# Patient Record
Sex: Male | Born: 2000 | Race: Black or African American | Hispanic: No | Smoking: Former smoker
Health system: Southern US, Community
[De-identification: ages and names within clinical notes are randomized; demographics above are authoritative.]

## PROBLEM LIST (undated history)

## (undated) ENCOUNTER — Emergency Department (HOSPITAL_COMMUNITY): Discharge status: Against Medical Advice | Payer: Medicaid Other

## (undated) DIAGNOSIS — F909 Attention-deficit hyperactivity disorder, unspecified type: Secondary | ICD-10-CM

## (undated) DIAGNOSIS — T50901A Poisoning by unspecified drugs, medicaments and biological substances, accidental (unintentional), initial encounter: Secondary | ICD-10-CM

## (undated) DIAGNOSIS — F419 Anxiety disorder, unspecified: Secondary | ICD-10-CM

## (undated) DIAGNOSIS — Z7289 Other problems related to lifestyle: Secondary | ICD-10-CM

## (undated) DIAGNOSIS — J45909 Unspecified asthma, uncomplicated: Secondary | ICD-10-CM

## (undated) DIAGNOSIS — F322 Major depressive disorder, single episode, severe without psychotic features: Secondary | ICD-10-CM

## (undated) HISTORY — DX: Unspecified asthma, uncomplicated: J45.909

## (undated) HISTORY — PX: CIRCUMCISION: SUR203

---
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
## 2019-05-12 ENCOUNTER — Ambulatory Visit (HOSPITAL_COMMUNITY)
Admission: EM | Admit: 2019-05-12 | Discharge: 2019-05-12 | Disposition: A | Discharge status: Home / Self Care | Payer: Medicaid Other | Attending: Family Medicine | Admitting: Family Medicine

## 2019-05-12 ENCOUNTER — Other Ambulatory Visit: Payer: Self-pay

## 2019-05-12 ENCOUNTER — Encounter (HOSPITAL_COMMUNITY): Payer: Self-pay

## 2019-05-12 DIAGNOSIS — R112 Nausea with vomiting, unspecified: Secondary | ICD-10-CM | POA: Diagnosis not present

## 2019-05-12 MED ORDER — ONDANSETRON HCL 4 MG PO TABS: 4.0000 mg | ORAL_TABLET | Freq: Four times a day (QID) | ORAL | 0 refills | Status: DC

## 2019-05-12 NOTE — ED Provider Notes (Signed)
[place]    CSN: [id number] Arrival date & time: 05/12/19  1611     History   Chief Complaint Chief Complaint  Patient presents with  . Diarrhea    HPI [name] is a 18 y.o. male with history of asthma, anxiety, ADHD, remote history of intentional overdose and deliberate self cutting in the setting of severe MDD presenting for deficit.  Patient states that he has had severe nausea for the last month "at least "and has been vomiting 4-5 times daily for the last 3 days.  Patient has also had non-watery diarrhea that has been without blood, mucus, melena.  Patient states he is never had this before.  Patient has been able to keep down some food and beverage, though feels like he is almost always nauseous.  Patient denies fever, lightheadedness, dizziness, weakness, myalgias, arthralgias, malaise, fatigue, cough, shortness of breath, chest pain, abdominal pain.  She denies known sick contacts, no recent travel or contact with COVID positive persons.  He also denies urinary symptoms such as frequency, urgency, penile or testicular pain or swelling, discharge.  Patient has been taking Pepto-Bismol and ginger ale which has helped some of his symptoms.  Patient denies history of abdominal surgery.  Patient does admit to cessation of heavy marijuana use around the time of symptom onset as he relocated to his parents home.  Patient states that he was smoking a lot almost every day prior to his move.  Patient denies alcohol use, other miscellaneous drug use.  Patient denies SI/HI/medication overdose.  Patient states "that was a few years ago, I do not feel that way now ".  No new foods, changes to diet, significant stress at home per patient.    Past Medical History:  Diagnosis Date  . ADHD (attention deficit hyperactivity disorder)   . Anxiety    Anxiety w/ Panic Attacks  . Asthma   . Deliberate self-cutting   . Overdose    Multiple Overdose attempts  . Severe major  depressive disorder [facility])     Patient Active Problem List   Diagnosis Date Noted  . Bipolar disorder ([place]) 05/11/2017  . DMDD (disruptive mood dysregulation disorder) ([place]) 05/11/2017  . Decreased appetite 08/23/2016  . Severe episode of recurrent major depressive disorder, without psychotic features ([place]) 08/17/2016  . Headache 02/16/2016  . Insomnia 02/15/2016  . Bipolar 2 disorder ([place]) 02/13/2016  . Generalized abdominal pain 08/22/2015  . Major depressive disorder, recurrent, moderate ([place]) 06/02/2015  . Suicide attempt by drug ingestion ([place]) 05/30/2014  . Overdose 05/30/2014  . Bradycardia, drug induced 05/30/2014  . Hypoventilation 05/30/2014  . Generalized anxiety disorder 04/19/2014  . Oppositional defiant disorder 04/19/2014    Past Surgical History:  Procedure Laterality Date  . CIRCUMCISION         Home Medications    Prior to Admission medications   Medication Sig Start Date End Date Taking? Authorizing Provider  chlorproMAZINE (THORAZINE) 25 MG tablet Take 1 tablet (25 mg total) by mouth 2 (two) times daily. Patient not taking: Reported on 07/26/2017 05/19/17   Philipp Ovens, MD  divalproex (DEPAKOTE ER) 250 MG 24 hr tablet Take 3 tablets (750 mg total) by mouth daily. Patient not taking: Reported on 07/26/2017 05/20/17   Philipp Ovens, MD  feeding supplement, ENSURE ENLIVE, (ENSURE ENLIVE) LIQD Take 237 mLs by mouth 2 (two) times daily between meals. Patient not taking: Reported on 07/26/2017 05/19/17   Philipp Ovens, MD  ondansetron [facility]) 4 MG  tablet Take 1 tablet (4 mg total) by mouth every 6 (six) hours. 05/12/19   Hall-Potvin, [place], PA-C    Family History History reviewed. No pertinent family history.  Social History Social History   Tobacco Use  . Smoking status: Former Smoker    Packs/day: 0.00    Types: Cigarettes  . Smokeless tobacco: Never Used  Substance Use Topics  . Alcohol use: No  . Drug use: Yes     Types: Marijuana    Comment: Hx of marijuana use, last used 1 week ago     Allergies   Penicillins   Review of Systems As per HPI   Physical Exam Triage Vital Signs ED Triage Vitals  Enc Vitals Group     BP 05/12/19 1656 131/77     Pulse Rate 05/12/19 1656 77     Resp 05/12/19 1656 16     Temp 05/12/19 1656 99 F (37.2 C)     Temp src --      SpO2 05/12/19 1656 100 %     Weight 05/12/19 1657 130 lb (59 kg)     Height --      Head Circumference --      Peak Flow --      Pain Score --      Pain Loc --      Pain Edu? --      Excl. in GC? --    No data found.  Updated Vital Signs BP 131/77 (BP Location: Right Arm)   Pulse 77   Temp 99 F (37.2 C)   Resp 16   Wt 130 lb (59 kg)   SpO2 100%   Visual Acuity Right Eye Distance:   Left Eye Distance:   Bilateral Distance:    Right Eye Near:   Left Eye Near:    Bilateral Near:     Physical Exam Constitutional:      General: He is not in acute distress.    Appearance: He is not toxic-appearing.  HENT:     Head: Normocephalic and atraumatic.     Right Ear: Tympanic membrane, ear canal and external ear normal.     Left Ear: Tympanic membrane, ear canal and external ear normal.     Nose: Nose normal.     Mouth/Throat:     Mouth: Mucous membranes are moist.     Pharynx: Oropharynx is clear. No oropharyngeal exudate or posterior oropharyngeal erythema.  Eyes:     General: No scleral icterus.       Right eye: No discharge.        Left eye: No discharge.     Extraocular Movements: Extraocular movements intact.     Conjunctiva/sclera: Conjunctivae normal.     Pupils: Pupils are equal, round, and reactive to light.  Neck:     Musculoskeletal: Normal range of motion and neck supple. No muscular tenderness.  Cardiovascular:     Rate and Rhythm: Normal rate and regular rhythm.     Heart sounds: Normal heart sounds.  Pulmonary:     Effort: Pulmonary effort is normal. No respiratory distress.     Breath sounds:  Normal breath sounds.  Abdominal:     General: Abdomen is flat. Bowel sounds are normal. There is no distension.     Palpations: Abdomen is soft.     Tenderness: There is no abdominal tenderness. There is no right CVA tenderness, left CVA tenderness, guarding or rebound.  Musculoskeletal:     Comments: Full active ROM  of upper and lower extremities bilaterally with 5/5 strength.  Gait is normal.  Lymphadenopathy:     Cervical: No cervical adenopathy.  Skin:    General: Skin is warm.     Capillary Refill: Capillary refill takes less than 2 seconds.     Coloration: Skin is not jaundiced or pale.     Findings: No bruising, erythema or rash.  Neurological:     General: No focal deficit present.     Mental Status: He is alert and oriented to person, place, and time.  Psychiatric:        Behavior: Behavior normal.        Thought Content: Thought content normal.        Judgment: Judgment normal.      UC Treatments / Results  Labs (all labs ordered are listed, but only abnormal results are displayed) Labs Reviewed - No data to display  EKG None  Radiology No results found.  Procedures Procedures (including critical care time)  Medications Ordered in UC Medications - No data to display  Initial Impression / Assessment and Plan / UC Course  I have reviewed the triage vital signs and the nursing notes.  Pertinent labs & imaging results that were available during my care of the patient were reviewed by me and considered in my medical decision making (see chart for details).     18 year old male with significant psychiatric history presenting for chronic nausea, recent vomiting.  Extensive ROS largely negative.  Overall, patient appears to be well, no acute distress.  Hemodynamically stable.  Does have elevated temperature, though no signs of hypovolemia.  Feels well otherwise.  Cessation of heavy, chronic marijuana use corresponds with symptom onset.  Discussed that this could be  cannabinoid hyperemesis, though cannot rule out viral etiology.  Patient looking to establish PCP (contact given).  Patient elects to defer diagnostic labs at that appointment as opposed to preliminary blood work in office today.  Discussed return precautions, patient verbalized understanding. Final Clinical Impressions(s) / UC Diagnoses   Final diagnoses:  Intractable vomiting with nausea, unspecified vomiting type     Discharge Instructions     Be sure to make appointment with PCP to have further work-up help determine cause of nausea. Avoid marijuana use in the interim. May take Zofran as needed for severe nausea. Return if you develop fever, lightheadedness/weakness, myalgias, chest pain, difficulty breathing, severe abdominal pain, issues urinating, or increased diarrhea.    ED Prescriptions    Medication Sig Dispense Auth. Provider   ondansetron (ZOFRAN) 4 MG tablet Take 1 tablet (4 mg total) by mouth every 6 (six) hours. 12 tablet Hall-Potvin, [place], PA-C     Controlled Substance Prescriptions [place] Controlled Substance Registry consulted? Not Applicable   Shea EvansHall-Potvin, Brittany, [place] JerseyPA-C 05/13/19 1818

## 2019-05-12 NOTE — ED Triage Notes (Signed)
Pt states he has nausea x 1 month or more and he has diarrhea x 2 days. Pt has been for x 3 days.

## 2019-05-12 NOTE — Discharge Instructions (Addendum)
Be sure to make appointment with PCP to have further work-up help determine cause of nausea. Avoid marijuana use in the interim. May take Zofran as needed for severe nausea. Return if you develop fever, lightheadedness/weakness, myalgias, chest pain, difficulty breathing, severe abdominal pain, issues urinating, or increased diarrhea.

## 2019-05-13 ENCOUNTER — Encounter (HOSPITAL_COMMUNITY): Payer: Self-pay | Admitting: Emergency Medicine

## 2019-06-17 ENCOUNTER — Ambulatory Visit (HOSPITAL_COMMUNITY)
Admission: EM | Admit: 2019-06-17 | Discharge: 2019-06-17 | Disposition: A | Discharge status: Home / Self Care | Payer: Medicaid Other | Attending: Family Medicine | Admitting: Family Medicine

## 2019-06-17 ENCOUNTER — Encounter (HOSPITAL_COMMUNITY): Payer: Self-pay | Admitting: Emergency Medicine

## 2019-06-17 ENCOUNTER — Other Ambulatory Visit: Payer: Self-pay

## 2019-06-17 DIAGNOSIS — M791 Myalgia, unspecified site: Secondary | ICD-10-CM

## 2019-06-17 LAB — CBC WITH DIFFERENTIAL/PLATELET
Abs Immature Granulocytes: 0.01 10*3/uL (ref 0.00–0.07)
Basophils Absolute: 0 10*3/uL (ref 0.0–0.1)
Basophils Relative: 0 %
Eosinophils Absolute: 0.6 10*3/uL — ABNORMAL HIGH (ref 0.0–0.5)
Eosinophils Relative: 11 %
HCT: 42.9 % (ref 39.0–52.0)
Hemoglobin: 14.4 g/dL (ref 13.0–17.0)
Immature Granulocytes: 0 %
Lymphocytes Relative: 38 %
Lymphs Abs: 1.9 10*3/uL (ref 0.7–4.0)
MCH: 29 pg (ref 26.0–34.0)
MCHC: 33.6 g/dL (ref 30.0–36.0)
MCV: 86.3 fL (ref 80.0–100.0)
Monocytes Absolute: 0.6 10*3/uL (ref 0.1–1.0)
Monocytes Relative: 12 %
Neutro Abs: 1.9 10*3/uL (ref 1.7–7.7)
Neutrophils Relative %: 39 %
Platelets: 244 10*3/uL (ref 150–400)
RBC: 4.97 MIL/uL (ref 4.22–5.81)
RDW: 12.3 % (ref 11.5–15.5)
WBC: 5 10*3/uL (ref 4.0–10.5)
nRBC: 0 % (ref 0.0–0.2)

## 2019-06-17 LAB — COMPREHENSIVE METABOLIC PANEL
ALT: 18 U/L (ref 0–44)
AST: 26 U/L (ref 15–41)
Albumin: 4.4 g/dL (ref 3.5–5.0)
Alkaline Phosphatase: 83 U/L (ref 38–126)
Anion gap: 10 (ref 5–15)
BUN: 14 mg/dL (ref 6–20)
CO2: 26 mmol/L (ref 22–32)
Calcium: 9.6 mg/dL (ref 8.9–10.3)
Chloride: 103 mmol/L (ref 98–111)
Creatinine, Ser: 0.8 mg/dL (ref 0.61–1.24)
GFR calc Af Amer: >60 mL/min (ref >60)
GFR calc non Af Amer: >60 mL/min (ref >60)
Glucose, Bld: 88 mg/dL (ref 70–99)
Potassium: 3.2 mmol/L — ABNORMAL LOW (ref 3.5–5.1)
Sodium: 139 mmol/L (ref 135–145)
Total Bilirubin: 2.2 mg/dL — ABNORMAL HIGH (ref 0.3–1.2)
Total Protein: 8.5 g/dL — ABNORMAL HIGH (ref 6.5–8.1)

## 2019-06-17 LAB — SEDIMENTATION RATE: Sed Rate: 20 mm/hr — ABNORMAL HIGH (ref 0–16)

## 2019-06-17 LAB — CK: Total CK: 435 U/L — ABNORMAL HIGH (ref 49–397)

## 2019-06-17 LAB — TSH: TSH: 1.07 u[IU]/mL (ref 0.350–4.500)

## 2019-06-17 MED ORDER — IBUPROFEN 800 MG PO TABS: 800.0000 mg | ORAL_TABLET | Freq: Three times a day (TID) | ORAL | 0 refills | Status: DC

## 2019-06-17 NOTE — ED Triage Notes (Signed)
Pt here for bilateral upper thigh pain that feels like "muscle strain" per pt

## 2019-06-17 NOTE — Discharge Instructions (Signed)
I am doing blood work to try to determine the cause of your muscle pain. In the meantime take ibuprofen 3 times a day with food Activity as tolerated You will need to follow-up with your primary care doctor

## 2019-06-17 NOTE — ED Provider Notes (Signed)
[place]    CSN: [id number] Arrival date & time: 06/17/19  1153      History   Chief Complaint Chief Complaint  Patient presents with  . Leg Pain    HPI [name] is a 18 y.o. male.   HPI  This is a 18 year old.  On no medications.  Currently working.  He states that he has been had pain in his leg muscles since Tuesday evening.  Started gradually throughout the day.  No accident, no injury, no overuse.  He states he tries to take a walk once a day but usually does not have any pain when doing so.  Has not changed activity.  Has not done any new exercises, squats, anything to strain his thigh muscles.  In addition today his left calf is starting to hurt. He states when he was in a grocery store yesterday his legs hurt so much that he felt like there were any give out on him. No body aches in general.  No fatigue.  No fever or chills.  No known exposure to coronavirus.  He states he is good about wearing his mask.  He does not go out of the house very much.  He is not working.  Past Medical History:  Diagnosis Date  . ADHD (attention deficit hyperactivity disorder)   . Anxiety    Anxiety w/ Panic Attacks  . Asthma   . Deliberate self-cutting   . Overdose    Multiple Overdose attempts  . Severe major depressive disorder [facility])     Patient Active Problem List   Diagnosis Date Noted  . DMDD (disruptive mood dysregulation disorder) ([place]) 05/11/2017  . Bipolar 2 disorder ([place]) 02/13/2016  . Major depressive disorder, recurrent, moderate ([place]) 06/02/2015  . Suicide attempt by drug ingestion ([place]) 05/30/2014  . Generalized anxiety disorder 04/19/2014  . Oppositional defiant disorder 04/19/2014    Past Surgical History:  Procedure Laterality Date  . CIRCUMCISION         Home Medications    Prior to Admission medications   Medication Sig Start Date End Date Taking? Authorizing Provider  ibuprofen (ADVIL) 800 MG tablet Take 1 tablet (800 mg total) by  mouth 3 (three) times daily. 06/17/19   Raylene Everts, MD  chlorproMAZINE (THORAZINE) 25 MG tablet Take 1 tablet (25 mg total) by mouth 2 (two) times daily. Patient not taking: Reported on 07/26/2017 05/19/17 06/17/19  Philipp Ovens, MD  divalproex (DEPAKOTE ER) 250 MG 24 hr tablet Take 3 tablets (750 mg total) by mouth daily. Patient not taking: Reported on 07/26/2017 05/20/17 06/17/19  Philipp Ovens, MD    Family History Family History  Problem Relation Age of Onset  . Asthma Mother     Social History Social History   Tobacco Use  . Smoking status: Former Smoker    Packs/day: 0.00    Types: Cigarettes  . Smokeless tobacco: Never Used  Substance Use Topics  . Alcohol use: No  . Drug use: Yes    Types: Marijuana    Comment: Hx of marijuana use, last used 1 week ago     Allergies   Penicillins   Review of Systems Review of Systems  Constitutional: Negative for chills and fever.  HENT: Negative for ear pain and sore throat.   Eyes: Negative for pain and visual disturbance.  Respiratory: Negative for cough and shortness of breath.   Cardiovascular: Negative for chest pain and palpitations.  Gastrointestinal: Negative for abdominal pain and  vomiting.  Genitourinary: Negative for dysuria and hematuria.  Musculoskeletal: Positive for gait problem and myalgias. Negative for arthralgias and back pain.  Skin: Negative for color change and rash.  Neurological: Negative for seizures and syncope.  All other systems reviewed and are negative.    Physical Exam Triage Vital Signs ED Triage Vitals [06/17/19 1218]  Enc Vitals Group     BP 125/86     Pulse Rate 78     Resp 18     Temp 98.4 F (36.9 C)     Temp Source Oral     SpO2 98 %     Weight      Height      Head Circumference      Peak Flow      Pain Score 0     Pain Loc      Pain Edu?      Excl. in GC?    No data found.  Updated Vital Signs BP 125/86 (BP Location: Right Arm)   Pulse  78   Temp 98.4 F (36.9 C) (Oral)   Resp 18   SpO2 98%   Visual Acuity Right Eye Distance:   Left Eye Distance:   Bilateral Distance:    Right Eye Near:   Left Eye Near:    Bilateral Near:     Physical Exam Constitutional:      General: He is not in acute distress.    Appearance: He is well-developed.  HENT:     Head: Normocephalic and atraumatic.  Eyes:     Conjunctiva/sclera: Conjunctivae normal.     Pupils: Pupils are equal, round, and reactive to light.  Neck:     Musculoskeletal: Normal range of motion.  Cardiovascular:     Rate and Rhythm: Normal rate and regular rhythm.     Heart sounds: Normal heart sounds.  Pulmonary:     Effort: Pulmonary effort is normal. No respiratory distress.     Breath sounds: Normal breath sounds.  Abdominal:     General: There is no distension.     Palpations: Abdomen is soft.  Musculoskeletal: Normal range of motion.     Comments: Patient is very lean.  Quadricep muscles are tender to palpation.  He is able to fully straighten legs against resistance without muscular weakness.  Skin:    General: Skin is warm and dry.  Neurological:     Mental Status: He is alert.      UC Treatments / Results  Labs (all labs ordered are listed, but only abnormal results are displayed) Labs Reviewed  COMPREHENSIVE METABOLIC PANEL  CBC WITH DIFFERENTIAL/PLATELET  SEDIMENTATION RATE  VITAMIN D 25 HYDROXY (VIT D DEFICIENCY, FRACTURES)  CK  TSH    EKG   Radiology No results found.  Procedures Procedures (including critical care time)  Medications Ordered in UC Medications - No data to display  Initial Impression / Assessment and Plan / UC Course  I have reviewed the triage vital signs and the nursing notes.  Pertinent labs & imaging results that were available during my care of the patient were reviewed by me and considered in my medical decision making (see chart for details).     Uncertain why this young man would have  myalgias.  No recent illness.  No fever or virus.  No known family history of muscular problems.  We will do screening lab work.  Notify him if any test results are positive.  He can follow-up with his PCP. Final  Clinical Impressions(s) / UC Diagnoses   Final diagnoses:  Myalgia     Discharge Instructions     I am doing blood work to try to determine the cause of your muscle pain. In the meantime take ibuprofen 3 times a day with food Activity as tolerated You will need to follow-up with your primary care doctor   ED Prescriptions    Medication Sig Dispense Auth. Provider   ibuprofen (ADVIL) 800 MG tablet Take 1 tablet (800 mg total) by mouth 3 (three) times daily. 21 tablet Eustace MooreNelson, Malachi Suderman Sue, MD     Controlled Substance Prescriptions [place] Controlled Substance Registry consulted? Not Applicable   Eustace MooreNelson, Japhet Morgenthaler Sue, MD 06/17/19 1319

## 2019-06-18 ENCOUNTER — Telehealth (HOSPITAL_COMMUNITY): Payer: Self-pay | Admitting: Emergency Medicine

## 2019-06-18 LAB — VITAMIN D 25 HYDROXY (VIT D DEFICIENCY, FRACTURES): Vit D, 25-Hydroxy: 22 ng/mL — ABNORMAL LOW (ref 30.0–100.0)

## 2019-06-18 NOTE — Telephone Encounter (Signed)
Attempted to reach patient. No answer at this time. Voicemail left.  Per Dr. Lanny Cramp pt needs to take OTC vit D.

## 2021-03-31 ENCOUNTER — Ambulatory Visit
Admission: EM | Admit: 2021-03-31 | Discharge: 2021-03-31 | Disposition: A | Discharge status: Home / Self Care | Payer: Medicaid Other | Attending: Family Medicine | Admitting: Family Medicine

## 2021-03-31 ENCOUNTER — Encounter: Payer: Self-pay | Admitting: Emergency Medicine

## 2021-03-31 ENCOUNTER — Other Ambulatory Visit: Payer: Self-pay

## 2021-03-31 DIAGNOSIS — B349 Viral infection, unspecified: Secondary | ICD-10-CM

## 2021-03-31 DIAGNOSIS — R509 Fever, unspecified: Secondary | ICD-10-CM

## 2021-03-31 MED ORDER — IBUPROFEN 600 MG PO TABS: 600.0000 mg | ORAL_TABLET | Freq: Four times a day (QID) | ORAL | 0 refills | Status: DC | PRN

## 2021-03-31 NOTE — Discharge Instructions (Signed)
Your COVID and Influenza tests are pending.  You should self quarantine until the test results are back.    Take Tylenol or ibuprofen as needed for fever or discomfort.  Rest and keep yourself hydrated.    Follow-up with your primary care provider if your symptoms are not improving.     

## 2021-03-31 NOTE — ED Provider Notes (Signed)
[facility]    CSN: [id number] Arrival date & time: 03/31/21  1450      History   Chief Complaint No chief complaint on file.   HPI [name] is a 20 y.o. male.   Reports headache, nausea for the last 2 days, body aches, cough, eye drainage, fever.  States that T-max was 101 last night at home.  Has taken over-the-counter cough and cold with no relief.  Reports that his fiance is also sick.  States that she was tested for COVID yesterday, and is awaiting her results.  Has negative history of COVID.  Has not completed COVID vaccines.  Has completed flu vaccine.  Denies abdominal pain, diarrhea, vomiting, rash, ear pain, other symptoms.  ROS per HPI  The history is provided by the patient.    Past Medical History:  Diagnosis Date  . ADHD (attention deficit hyperactivity disorder)   . Anxiety    Anxiety w/ Panic Attacks  . Asthma   . Deliberate self-cutting   . Overdose    Multiple Overdose attempts  . Severe major depressive disorder [facility])     Patient Active Problem List   Diagnosis Date Noted  . DMDD (disruptive mood dysregulation disorder) ([facility]) 05/11/2017  . Bipolar 2 disorder ([facility]) 02/13/2016  . Major depressive disorder, recurrent, moderate ([facility]) 06/02/2015  . Suicide attempt by drug ingestion ([facility]) 05/30/2014  . Generalized anxiety disorder 04/19/2014  . Oppositional defiant disorder 04/19/2014    Past Surgical History:  Procedure Laterality Date  . CIRCUMCISION         Home Medications    Prior to Admission medications   Medication Sig Start Date End Date Taking? Authorizing Provider  ibuprofen (ADVIL) 600 MG tablet Take 1 tablet (600 mg total) by mouth every 6 (six) hours as needed. 03/31/21  Yes Moshe Cipro, NP  chlorproMAZINE (THORAZINE) 25 MG tablet Take 1 tablet (25 mg total) by mouth 2 (two) times daily. Patient not taking: Reported on 07/26/2017 05/19/17 06/17/19  Thedora Hinders, MD  divalproex (DEPAKOTE ER)  250 MG 24 hr tablet Take 3 tablets (750 mg total) by mouth daily. Patient not taking: Reported on 07/26/2017 05/20/17 06/17/19  Thedora Hinders, MD    Family History Family History  Problem Relation Age of Onset  . Asthma Mother     Social History Social History   Tobacco Use  . Smoking status: Former Smoker    Packs/day: 0.00    Types: Cigarettes  . Smokeless tobacco: Never Used  Vaping Use  . Vaping Use: Never used  Substance Use Topics  . Alcohol use: No  . Drug use: Yes    Types: Marijuana    Comment: Hx of marijuana use, last used 1 week ago     Allergies   Penicillins   Review of Systems Review of Systems   Physical Exam Triage Vital Signs ED Triage Vitals  Enc Vitals Group     BP 03/31/21 1458 113/74     Pulse Rate 03/31/21 1458 81     Resp 03/31/21 1458 18     Temp 03/31/21 1458 99.7 F (37.6 C)     Temp Source 03/31/21 1458 Oral     SpO2 03/31/21 1458 95 %     Weight --      Height --      Head Circumference --      Peak Flow --      Pain Score 03/31/21 1500 9     Pain Loc --  Pain Edu? --      Excl. in GC? --    No data found.  Updated Vital Signs BP 113/74 (BP Location: Right Arm)   Pulse 81   Temp 99.7 F (37.6 C) (Oral)   Resp 18   SpO2 95%       Physical Exam Vitals and nursing note reviewed.  Constitutional:      General: He is not in acute distress.    Appearance: Normal appearance. He is well-developed and normal weight. He is ill-appearing.  HENT:     Head: Normocephalic and atraumatic.     Right Ear: Tympanic membrane, ear canal and external ear normal.     Left Ear: Tympanic membrane, ear canal and external ear normal.     Nose: Congestion and rhinorrhea present.     Mouth/Throat:     Mouth: Mucous membranes are moist.     Pharynx: Posterior oropharyngeal erythema present.  Eyes:     Extraocular Movements: Extraocular movements intact.     Conjunctiva/sclera: Conjunctivae normal.     Pupils: Pupils are  equal, round, and reactive to light.  Cardiovascular:     Rate and Rhythm: Normal rate and regular rhythm.     Heart sounds: Normal heart sounds. No murmur heard.   Pulmonary:     Effort: Pulmonary effort is normal. No respiratory distress.     Breath sounds: Normal breath sounds. No stridor. No wheezing, rhonchi or rales.  Chest:     Chest wall: No tenderness.  Abdominal:     General: Bowel sounds are normal.     Palpations: Abdomen is soft.     Tenderness: There is no abdominal tenderness.  Musculoskeletal:        General: Normal range of motion.     Cervical back: Neck supple.  Lymphadenopathy:     Cervical: Cervical adenopathy present.  Skin:    General: Skin is warm and dry.     Capillary Refill: Capillary refill takes less than 2 seconds.  Neurological:     General: No focal deficit present.     Mental Status: He is alert and oriented to person, place, and time.  Psychiatric:        Mood and Affect: Mood normal.        Behavior: Behavior normal.        Thought Content: Thought content normal.      UC Treatments / Results  Labs (all labs ordered are listed, but only abnormal results are displayed) Labs Reviewed  COVID-19, FLU A+B NAA    EKG   Radiology No results found.  Procedures Procedures (including critical care time)  Medications Ordered in UC Medications - No data to display  Initial Impression / Assessment and Plan / UC Course  I have reviewed the triage vital signs and the nursing notes.  Pertinent labs & imaging results that were available during my care of the patient were reviewed by me and considered in my medical decision making (see chart for details).    Viral illness Fever  Work note provided Prescribed ibuprofen prn Covid and flu swab obtained in office today.   Patient instructed to quarantine until results are back and negative.   If results are negative, patient may resume daily schedule as tolerated once they are fever free  for 24 hours without the use of antipyretic medications.   If results are positive, patient instructed to quarantine for at least 5 days from symptom onset.  If after 5 days  symptoms have resolved, may return to work with a well fitting mask for the next 5 days. If symptomatic after day 5, isolation should be extended to 10 days. Patient instructed to follow-up with primary care or with this office as needed.   Patient instructed to follow-up in the ER for trouble swallowing, trouble breathing, other concerning symptoms.   Final Clinical Impressions(s) / UC Diagnoses   Final diagnoses:  Viral illness  Fever, unspecified fever cause     Discharge Instructions     Your COVID and Influenza tests are pending.  You should self quarantine until the test results are back.    Take Tylenol or ibuprofen as needed for fever or discomfort.  Rest and keep yourself hydrated.    Follow-up with your primary care provider if your symptoms are not improving.        ED Prescriptions    Medication Sig Dispense Auth. Provider   ibuprofen (ADVIL) 600 MG tablet Take 1 tablet (600 mg total) by mouth every 6 (six) hours as needed. 30 tablet Moshe Cipro, NP     PDMP not reviewed this encounter.   Moshe Cipro, NP 03/31/21 1519

## 2021-03-31 NOTE — ED Triage Notes (Signed)
Headache and nausea 2 days ago, body aches, coughing eye drainage, fever

## 2021-04-02 LAB — COVID-19, FLU A+B NAA
Influenza A, NAA: NOT DETECTED
Influenza B, NAA: NOT DETECTED
SARS-CoV-2, NAA: DETECTED — AB

## 2021-04-05 ENCOUNTER — Other Ambulatory Visit: Payer: Self-pay

## 2021-04-05 ENCOUNTER — Encounter: Payer: Self-pay | Admitting: Emergency Medicine

## 2021-04-05 ENCOUNTER — Ambulatory Visit
Admission: EM | Admit: 2021-04-05 | Discharge: 2021-04-05 | Disposition: A | Discharge status: Home / Self Care | Payer: Medicaid Other

## 2021-04-05 DIAGNOSIS — U071 COVID-19: Secondary | ICD-10-CM | POA: Diagnosis not present

## 2021-04-05 DIAGNOSIS — J069 Acute upper respiratory infection, unspecified: Secondary | ICD-10-CM

## 2021-04-05 NOTE — Discharge Instructions (Signed)
Unable to rule out cardiac disease or blood clot in urgent care setting.  Offered patient further evaluation and management in the ED.  Patient declines at this time and would like to try outpatient therapy first.  Aware of the risk associated with this decision including missed diagnosis, organ damage, organ failure, and/or death.  Patient aware and in agreement.     Get plenty of rest and push fluids Continue with prescribed medications Use OTC zyrtec for nasal congestion, runny nose, and/or sore throat Use OTC flonase for nasal congestion and runny nose Use medications daily for symptom relief Use OTC medications like ibuprofen or tylenol as needed fever or pain Call or go to the ED if you have any new or worsening symptoms such as fever, worsening cough, shortness of breath, chest tightness, chest pain, turning blue, changes in mental status, etc..Marland Kitchen

## 2021-04-05 NOTE — ED Triage Notes (Signed)
Tested positive for covid on May 14th.  States symptoms have become worse.  Sore throat, headache, body aches, and cough.
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
## 2021-09-13 ENCOUNTER — Ambulatory Visit: Payer: Medicaid Other | Attending: Internal Medicine | Admitting: Internal Medicine

## 2021-09-13 ENCOUNTER — Other Ambulatory Visit: Payer: Self-pay

## 2021-09-13 ENCOUNTER — Encounter: Payer: Self-pay | Admitting: Internal Medicine

## 2021-09-13 VITALS — BP 120/70 | HR 86 | Resp 16 | Ht 72.0 in | Wt 163.4 lb

## 2021-09-13 DIAGNOSIS — F411 Generalized anxiety disorder: Secondary | ICD-10-CM

## 2021-09-13 DIAGNOSIS — Z8659 Personal history of other mental and behavioral disorders: Secondary | ICD-10-CM | POA: Diagnosis not present

## 2021-09-13 DIAGNOSIS — F321 Major depressive disorder, single episode, moderate: Secondary | ICD-10-CM | POA: Diagnosis not present

## 2021-09-13 DIAGNOSIS — Z0000 Encounter for general adult medical examination without abnormal findings: Secondary | ICD-10-CM

## 2021-09-13 DIAGNOSIS — Z2821 Immunization not carried out because of patient refusal: Secondary | ICD-10-CM

## 2021-09-13 DIAGNOSIS — J4520 Mild intermittent asthma, uncomplicated: Secondary | ICD-10-CM

## 2021-09-13 NOTE — Patient Instructions (Signed)
Preventive Care 18-21 Years Old, Male Preventive care refers to lifestyle choices and visits with your health care provider that can promote health and wellness. At this stage in your life, you may start seeing a primary care physician instead of a pediatrician. It is important to take responsibility for your health and well-being. Preventive care for young adults includes: A yearly physical exam. This is also called an annual wellness visit. Regular dental and eye exams. Immunizations. Screening for certain conditions. Healthy lifestyle choices, such as: Eating a healthy diet. Getting regular exercise. Not using drugs or products that contain nicotine and tobacco. Limiting alcohol use. What can I expect for my preventive care visit? Physical exam Your health care provider may check your: Height and weight. These may be used to calculate your BMI (body mass index). BMI is a measurement that tells if you are at a healthy weight. Heart rate and blood pressure. Body temperature. Skin for abnormal spots. Counseling Your health care provider may ask you questions about your: Past medical problems. Family's medical history. Alcohol, tobacco, and drug use. Home life and relationship well-being. Access to firearms. Emotional well-being. Diet, exercise, and sleep habits. Sexual activity and sexual health. What immunizations do I need? Vaccines are usually given at various ages, according to a schedule. Your health care provider will recommend vaccines for you based on your age, medical history, and lifestyle or other factors, such as travel or where you work. What tests do I need? Blood tests Lipid and cholesterol levels. These may be checked every 5 years starting at age 20. Hepatitis C test. Hepatitis B test. Screening Genital exam to check for testicular cancer or hernias. STD (sexually transmitted disease) testing, if you are at risk. Other tests Tuberculosis skin test. Vision and  hearing tests. Skin exam. Talk with your health care provider about your test results, treatment options, and if necessary, the need for more tests. Follow these instructions at home: Eating and drinking  Eat a healthy diet that includes fresh fruits and vegetables, whole grains, lean protein, and low-fat dairy products. Drink enough fluid to keep your urine pale yellow. Do not drink alcohol if: Your health care provider tells you not to drink. You are under the legal drinking age. In the [place] the legal drinking age is 21. If you drink alcohol: Limit how much you use to 0-2 drinks a day. Be aware of how much alcohol is in your drink. In the [place] one drink equals one 12 oz bottle of beer (355 mL), one 5 oz glass of wine (148 mL), or one 1 oz glass of hard liquor (44 mL). Lifestyle Take daily care of your teeth and gums. Brush your teeth every morning and night with fluoride toothpaste. Floss one time each day. Stay active. Exercise for at least 30 minutes 5 or more days of the week. Do not use any products that contain nicotine or tobacco, such as cigarettes, e-cigarettes, and chewing tobacco. If you need help quitting, ask your health care provider. Do not use drugs. If you are sexually active, practice safe sex. Use a condom or other form of protection to prevent STIs (sexually transmitted infections). Find healthy ways to cope with stress, such as: Meditation, yoga, or listening to music. Journaling. Talking to a trusted person. Spending time with friends and family. Safety Always wear your seat belt while driving or riding in a vehicle. Do not drive: If you have been drinking alcohol. Do not ride with someone who has been drinking.   When you are tired or distracted. While texting. Wear a helmet and other protective equipment during sports activities. If you have firearms in your house, make sure you follow all gun safety procedures. Seek help if you have been bullied, physically  abused, or sexually abused. Use the Internet responsibly to avoid dangers, such as online bullying and online sex predators. What's next? Go to your health care provider once a year for an annual wellness visit. Ask your health care provider how often you should have your eyes and teeth checked. Stay up to date on all vaccines. This information is not intended to replace advice given to you by your health care provider. Make sure you discuss any questions you have with your health care provider. Document Revised: 01/12/2021 Document Reviewed: 10/29/2018 Elsevier Patient Education  2022 Elsevier Inc.  
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
## 2021-10-08 ENCOUNTER — Telehealth: Payer: Self-pay | Admitting: Clinical

## 2022-03-15 NOTE — Telephone Encounter (Signed)
Error

## 2022-06-13 ENCOUNTER — Encounter (HOSPITAL_COMMUNITY): Payer: Self-pay

## 2022-06-13 ENCOUNTER — Other Ambulatory Visit: Payer: Self-pay

## 2022-06-13 DIAGNOSIS — J45909 Unspecified asthma, uncomplicated: Secondary | ICD-10-CM | POA: Diagnosis present

## 2022-06-13 DIAGNOSIS — Z5321 Procedure and treatment not carried out due to patient leaving prior to being seen by health care provider: Secondary | ICD-10-CM | POA: Diagnosis not present

## 2022-06-13 NOTE — ED Triage Notes (Signed)
Pt presents with trouble breathing due to seasonal allergies. Pt has his inhaler but he is out of puffs. Wanting a breathing treatment and inhaler. He in no sign of respiratory distress.

## 2022-06-14 ENCOUNTER — Emergency Department (HOSPITAL_COMMUNITY)
Admission: EM | Admit: 2022-06-14 | Discharge: 2022-06-14 | Discharge status: Against Medical Advice | Payer: Medicaid Other | Attending: Emergency Medicine | Admitting: Emergency Medicine

## 2022-06-16 ENCOUNTER — Other Ambulatory Visit: Payer: Self-pay

## 2022-06-16 ENCOUNTER — Encounter (HOSPITAL_COMMUNITY): Payer: Self-pay

## 2022-06-16 ENCOUNTER — Emergency Department (HOSPITAL_COMMUNITY)
Admission: EM | Admit: 2022-06-16 | Discharge: 2022-06-16 | Disposition: A | Discharge status: Home / Self Care | Payer: Medicaid Other | Attending: Emergency Medicine | Admitting: Emergency Medicine

## 2022-06-16 DIAGNOSIS — J4541 Moderate persistent asthma with (acute) exacerbation: Secondary | ICD-10-CM

## 2022-06-16 DIAGNOSIS — J45909 Unspecified asthma, uncomplicated: Secondary | ICD-10-CM | POA: Diagnosis not present

## 2022-06-16 DIAGNOSIS — R0602 Shortness of breath: Secondary | ICD-10-CM | POA: Diagnosis present

## 2022-06-16 MED ORDER — PREDNISONE 20 MG PO TABS
20.0000 mg | ORAL_TABLET | Freq: Once | ORAL | Status: AC
  Administered 2022-06-16: 20 mg via ORAL
  Filled 2022-06-16: qty 1

## 2022-06-16 MED ORDER — IPRATROPIUM-ALBUTEROL 0.5-2.5 (3) MG/3ML IN SOLN
3.0000 mL | Freq: Once | RESPIRATORY_TRACT | Status: AC
  Administered 2022-06-16: 3 mL via RESPIRATORY_TRACT
  Filled 2022-06-16: qty 3

## 2022-06-16 MED ORDER — PREDNISONE 10 MG PO TABS: 20.0000 mg | ORAL_TABLET | Freq: Two times a day (BID) | ORAL | 0 refills | Status: DC

## 2022-06-16 NOTE — ED Triage Notes (Signed)
POV from home. Cc of wanting a neb tx for his asthma. Says he has an inhaler but would like breathing treatment. 95% while ambulating

## 2022-06-16 NOTE — ED Provider Notes (Signed)
[facility] EMERGENCY DEPARTMENT Provider Note   CSN: [id number] Arrival date & time: 06/16/22  0151     History  Chief Complaint  Patient presents with   Wants Neb Tx    [name] is a 21 y.o. male.  Patient is a 21 year old male with history of asthma.  He presents today with complaints of shortness of breath.  This has been worsening over the past several weeks.  He denies to me he is having any chest pain, fevers, chills, or productive cough.  He has an inhaler at home and has been using this with minimal relief.  He is requesting a nebulizer treatment.  The history is provided by the patient.       Home Medications Prior to Admission medications   Medication Sig Start Date End Date Taking? Authorizing Provider  ibuprofen (ADVIL) 600 MG tablet Take 1 tablet (600 mg total) by mouth every 6 (six) hours as needed. 03/31/21   Moshe Cipro, NP  chlorproMAZINE (THORAZINE) 25 MG tablet Take 1 tablet (25 mg total) by mouth 2 (two) times daily. Patient not taking: Reported on 07/26/2017 05/19/17 06/17/19  Saez-Benito, Phillips Climes, MD  divalproex (DEPAKOTE ER) 250 MG 24 hr tablet Take 3 tablets (750 mg total) by mouth daily. Patient not taking: Reported on 07/26/2017 05/20/17 06/17/19  Saez-Benito, Phillips Climes, MD      Allergies    Penicillins    Review of Systems   Review of Systems  All other systems reviewed and are negative.   Physical Exam Updated Vital Signs BP 114/63   Pulse 80   Temp 98.1 F (36.7 C) (Oral)   Resp 19   Ht 6\' 1"  (1.854 m)   Wt 68 kg   SpO2 98%   BMI 19.79 kg/m  Physical Exam Vitals and nursing note reviewed.  Constitutional:      General: He is not in acute distress.    Appearance: He is well-developed. He is not diaphoretic.  HENT:     Head: Normocephalic and atraumatic.  Cardiovascular:     Rate and Rhythm: Normal rate and regular rhythm.     Heart sounds: No murmur heard.    No friction rub.  Pulmonary:     Effort:  Pulmonary effort is normal. No respiratory distress.     Breath sounds: Wheezing present. No rales.     Comments: There are slight wheezes bilaterally, but no respiratory distress. Abdominal:     General: Bowel sounds are normal. There is no distension.     Palpations: Abdomen is soft.     Tenderness: There is no abdominal tenderness.  Musculoskeletal:        General: Normal range of motion.     Cervical back: Normal range of motion and neck supple.  Skin:    General: Skin is warm and dry.  Neurological:     Mental Status: He is alert and oriented to person, place, and time.     Coordination: Coordination normal.     ED Results / Procedures / Treatments   Labs (all labs ordered are listed, but only abnormal results are displayed) Labs Reviewed - No data to display  EKG None  Radiology No results found.  Procedures Procedures    Medications Ordered in ED Medications  ipratropium-albuterol (DUONEB) 0.5-2.5 (3) MG/3ML nebulizer solution 3 mL (has no administration in time range)  predniSONE (DELTASONE) tablet 20 mg (has no administration in time range)    ED Course/ Medical Decision Making/  A&P  Patient presenting with complaints of shortness of breath that has been gradually worsening over the past several weeks.  He has history of asthma and feels as though this is the cause.  He has been using his inhaler with little relief.  He is requesting a nebulizer treatment which was given.  Patient seems to be feeling improved.  He was also given a dose of prednisone and will be given a short course of this and see if this helps.  Patient to be discharged with continued use of his inhaler and the steroid.  Final Clinical Impression(s) / ED Diagnoses Final diagnoses:  None    Rx / DC Orders ED Discharge Orders     None         Geoffery Lyons, MD 06/16/22 [phone]

## 2022-06-16 NOTE — Discharge Instructions (Signed)
Begin taking prednisone as prescribed.  Continue use of the albuterol inhaler, 2 puffs every 4 hours as needed.  Return to the emergency department if symptoms significantly worsen or change.

## 2022-08-03 ENCOUNTER — Emergency Department (HOSPITAL_COMMUNITY)
Admission: EM | Admit: 2022-08-03 | Discharge: 2022-08-03 | Disposition: A | Discharge status: Home / Self Care | Payer: Medicaid Other | Attending: Emergency Medicine | Admitting: Emergency Medicine

## 2022-08-03 ENCOUNTER — Other Ambulatory Visit: Payer: Self-pay

## 2022-08-03 ENCOUNTER — Encounter (HOSPITAL_COMMUNITY): Payer: Self-pay | Admitting: *Deleted

## 2022-08-03 DIAGNOSIS — J45909 Unspecified asthma, uncomplicated: Secondary | ICD-10-CM | POA: Insufficient documentation

## 2022-08-03 DIAGNOSIS — R062 Wheezing: Secondary | ICD-10-CM | POA: Diagnosis present

## 2022-08-03 MED ORDER — ALBUTEROL SULFATE HFA 108 (90 BASE) MCG/ACT IN AERS
2.0000 | INHALATION_SPRAY | Freq: Once | RESPIRATORY_TRACT | Status: AC
  Administered 2022-08-03: 2 via RESPIRATORY_TRACT
  Filled 2022-08-03: qty 6.7

## 2022-08-03 MED ORDER — ALBUTEROL SULFATE HFA 108 (90 BASE) MCG/ACT IN AERS: 2.0000 | INHALATION_SPRAY | Freq: Four times a day (QID) | RESPIRATORY_TRACT | 1 refills | Status: AC | PRN

## 2022-08-03 NOTE — ED Provider Notes (Signed)
[facility] EMERGENCY DEPARTMENT Provider Note   CSN: [id number] Arrival date & time: 08/03/22  2256     History  Chief Complaint  Patient presents with   Shortness of Breath    [name] is a 21 y.o. male.   Shortness of Breath  Patient reports history of asthma and he is here for an albuterol inhaler.  He reports he smokes marijuana daily but is trying to cut back He works at [employer] and the flour in the air makes him cough. No chest pain, no fevers, no vomiting   Past Medical History:  Diagnosis Date   ADHD (attention deficit hyperactivity disorder)    Anxiety    Anxiety w/ Panic Attacks   Asthma    Deliberate self-cutting    Overdose    Multiple Overdose attempts   Severe major depressive disorder (HCC)     Home Medications Prior to Admission medications   Medication Sig Start Date End Date Taking? Authorizing Provider  albuterol (VENTOLIN HFA) 108 (90 Base) MCG/ACT inhaler Inhale 2 puffs into the lungs every 6 (six) hours as needed for wheezing or shortness of breath. 08/03/22  Yes Zadie Rhine, MD  chlorproMAZINE (THORAZINE) 25 MG tablet Take 1 tablet (25 mg total) by mouth 2 (two) times daily. Patient not taking: Reported on 07/26/2017 05/19/17 06/17/19  Saez-Benito, Phillips Climes, MD  divalproex (DEPAKOTE ER) 250 MG 24 hr tablet Take 3 tablets (750 mg total) by mouth daily. Patient not taking: Reported on 07/26/2017 05/20/17 06/17/19  Saez-Benito, Phillips Climes, MD      Allergies    Penicillins    Review of Systems   Review of Systems  Respiratory:  Positive for shortness of breath.     Physical Exam Updated Vital Signs BP 115/74 (BP Location: Right Arm)   Pulse 70   Temp 98.1 F (36.7 C) (Oral)   Resp 15   Ht 1.854 m (6\' 1" )   Wt 68 kg   SpO2 97%   BMI 19.79 kg/m  Physical Exam CONSTITUTIONAL: Well developed/well nourished, no distress chewing bubblegum HEAD: Normocephalic/atraumatic EYES: EOMI CV: S1/S2 noted, no  murmurs/rubs/gallops noted LUNGS: No wheeze, no acute distress, no crackles NEURO: Pt is awake/alert/appropriate, moves all extremitiesx4.  No facial droop.   SKIN: warm, color normal PSYCH: no abnormalities of mood noted, alert and oriented to situation  ED Results / Procedures / Treatments   Labs (all labs ordered are listed, but only abnormal results are displayed) Labs Reviewed - No data to display  EKG None  Radiology No results found.  Procedures Procedures    Medications Ordered in ED Medications  albuterol (VENTOLIN HFA) 108 (90 Base) MCG/ACT inhaler 2 puff (2 puffs Inhalation Given 08/03/22 2338)    ED Course/ Medical Decision Making/ A&P                           Medical Decision Making Risk Prescription drug management.   Patient is in no distress.  Discussed need to cut back on marijuana use Will provide albuterol       Final Clinical Impression(s) / ED Diagnoses Final diagnoses:  Wheezing    Rx / DC Orders ED Discharge Orders          Ordered    albuterol (VENTOLIN HFA) 108 (90 Base) MCG/ACT inhaler  Every 6 hours PRN        08/03/22 2330  Ripley Fraise, MD 08/03/22 [phone]

## 2022-08-03 NOTE — ED Triage Notes (Signed)
Pt with asthma and ran out of his inhaler yesterday. Pt with SOB a week ago. Pt in no distress, able to speak in complete sentences.

## 2023-01-04 ENCOUNTER — Encounter (HOSPITAL_COMMUNITY): Payer: Self-pay

## 2023-01-04 ENCOUNTER — Ambulatory Visit (HOSPITAL_COMMUNITY)
Admission: EM | Admit: 2023-01-04 | Discharge: 2023-01-04 | Disposition: A | Discharge status: Home / Self Care | Payer: Medicaid Other | Attending: Physician Assistant | Admitting: Physician Assistant

## 2023-01-04 DIAGNOSIS — R300 Dysuria: Secondary | ICD-10-CM | POA: Insufficient documentation

## 2023-01-04 DIAGNOSIS — N342 Other urethritis: Secondary | ICD-10-CM | POA: Insufficient documentation

## 2023-01-04 LAB — POCT URINALYSIS DIPSTICK, ED / UC
Bilirubin Urine: NEGATIVE
Glucose, UA: NEGATIVE mg/dL
Ketones, ur: NEGATIVE mg/dL
Nitrite: NEGATIVE
Protein, ur: NEGATIVE mg/dL
Specific Gravity, Urine: 1.02 (ref 1.005–1.030)
Urobilinogen, UA: 0.2 mg/dL (ref 0.0–1.0)
pH: 8.5 — ABNORMAL HIGH (ref 5.0–8.0)

## 2023-01-04 NOTE — ED Triage Notes (Signed)
Pt states dysuria and penile discharge x 2 days. Pt reports expose to STD.

## 2023-01-04 NOTE — ED Provider Notes (Signed)
[facility]    CSN: [id number] Arrival date & time: 01/04/23  1427      History   Chief Complaint Chief Complaint  Patient presents with   Penile Discharge   Urinary Tract Infection    HPI [name] is a 22 y.o. male.   22 year old male presents with penile discharge and dysuria.  Patient indicates that his last sexual encounter was 3 days ago unprotected.  He relates yesterday he started having penile discharge which is creamy, with dysuria.  He indicates he is not having any back pain, fever or chills.  Patient is concerned that he may have an STI.  He is tolerating fluids well.   Penile Discharge  Urinary Tract Infection Presenting symptoms: penile discharge (creamy)     Past Medical History:  Diagnosis Date   ADHD (attention deficit hyperactivity disorder)    Anxiety    Anxiety w/ Panic Attacks   Asthma    Deliberate self-cutting    Overdose    Multiple Overdose attempts   Severe major depressive disorder [facility])     Patient Active Problem List   Diagnosis Date Noted   DMDD (disruptive mood dysregulation disorder) ([place]) 05/11/2017   Bipolar 2 disorder ([place]) 02/13/2016   Major depressive disorder, recurrent, moderate ([place]) 06/02/2015   Suicide attempt by drug ingestion ([place]) 05/30/2014   Generalized anxiety disorder 04/19/2014   Oppositional defiant disorder 04/19/2014    Past Surgical History:  Procedure Laterality Date   CIRCUMCISION         Home Medications    Prior to Admission medications   Medication Sig Start Date End Date Taking? Authorizing Provider  albuterol (VENTOLIN HFA) 108 (90 Base) MCG/ACT inhaler Inhale 2 puffs into the lungs every 6 (six) hours as needed for wheezing or shortness of breath. 08/03/22   Ripley Fraise, MD  chlorproMAZINE (THORAZINE) 25 MG tablet Take 1 tablet (25 mg total) by mouth 2 (two) times daily. Patient not taking: Reported on 07/26/2017 05/19/17 06/17/19  Saez-Benito, Roxy Manns, MD   divalproex (DEPAKOTE ER) 250 MG 24 hr tablet Take 3 tablets (750 mg total) by mouth daily. Patient not taking: Reported on 07/26/2017 05/20/17 06/17/19  Saez-Benito, Roxy Manns, MD    Family History Family History  Problem Relation Age of Onset   Asthma Mother     Social History Social History   Tobacco Use   Smoking status: Former    Packs/day: 0.00    Types: Cigarettes   Smokeless tobacco: Never  Vaping Use   Vaping Use: Never used  Substance Use Topics   Alcohol use: No   Drug use: Yes    Types: Marijuana     Allergies   Penicillins   Review of Systems Review of Systems  Genitourinary:  Positive for penile discharge (creamy).     Physical Exam Triage Vital Signs ED Triage Vitals [01/04/23 1542]  Enc Vitals Group     BP 135/78     Pulse Rate 75     Resp 16     Temp 98.4 F (36.9 C)     Temp Source Oral     SpO2 100 %     Weight      Height      Head Circumference      Peak Flow      Pain Score      Pain Loc      Pain Edu?      Excl. in [place]?    No  data found.  Updated Vital Signs BP 135/78 (BP Location: Left Arm)   Pulse 75   Temp 98.4 F (36.9 C) (Oral)   Resp 16   SpO2 100%   Visual Acuity Right Eye Distance:   Left Eye Distance:   Bilateral Distance:    Right Eye Near:   Left Eye Near:    Bilateral Near:     Physical Exam Constitutional:      Appearance: Normal appearance.  Neurological:     Mental Status: He is alert.      UC Treatments / Results  Labs (all labs ordered are listed, but only abnormal results are displayed) Labs Reviewed  POCT URINALYSIS DIPSTICK, ED / UC - Abnormal; Notable for the following components:      Result Value   Hgb urine dipstick TRACE (*)    pH 8.5 (*)    Leukocytes,Ua MODERATE (*)    All other components within normal limits  CYTOLOGY, (ORAL, ANAL, URETHRAL) ANCILLARY ONLY    EKG   Radiology No results found.  Procedures Procedures (including critical care time)  Medications  Ordered in UC Medications - No data to display  Initial Impression / Assessment and Plan / UC Course  I have reviewed the triage vital signs and the nursing notes.  Pertinent labs & imaging results that were available during my care of the patient were reviewed by me and considered in my medical decision making (see chart for details).    Plan: Diagnosis to be treated with the following: 1.  Dysuria: A.  Advised to increase fluid intake to flush the urinary system. 2.  Urethritis: A.  STI lab testing results are pending, treatment may be initiated depending on results of the test. 3.  Advised to abstain from intercourse until lab results come in and treatment if warranted is completed. 4.  Advised follow-up PCP or return to urgent care as needed.  Final Clinical Impressions(s) / UC Diagnoses   Final diagnoses:  Dysuria  Urethritis     Discharge Instructions      Lab results to be completed in 48 hours.  If you do not hear from this office in 48 hours that indicates lab test are negative.  Log onto MyChart to be the test results when they post in 48 hours.  Advised to follow-up PCP or return to urgent care if symptoms fail to improve.    ED Prescriptions   None    PDMP not reviewed this encounter.   Nyoka Lint, PA-C 01/04/23 1622

## 2023-01-04 NOTE — Discharge Instructions (Signed)
Lab results to be completed in 48 hours.  If you do not hear from this office in 48 hours that indicates lab test are negative.  Log onto MyChart to be the test results when they post in 48 hours.  Advised to follow-up PCP or return to urgent care if symptoms fail to improve.

## 2023-01-06 ENCOUNTER — Telehealth (HOSPITAL_COMMUNITY): Payer: Self-pay | Admitting: Emergency Medicine

## 2023-01-06 LAB — CYTOLOGY, (ORAL, ANAL, URETHRAL) ANCILLARY ONLY
Chlamydia: NEGATIVE
Comment: NEGATIVE
Comment: NEGATIVE
Comment: NORMAL
Neisseria Gonorrhea: POSITIVE — AB
Trichomonas: POSITIVE — AB

## 2023-01-06 MED ORDER — METRONIDAZOLE 500 MG PO TABS: 2000.0000 mg | ORAL_TABLET | Freq: Once | ORAL | 0 refills | Status: AC

## 2023-01-06 NOTE — Telephone Encounter (Signed)
Per protocol, patient will need treatment with Gentamicin for positive Gonorrhea and Flagyl.  Results seen on mychart Contacted patient by phone.  Verified identity using two identifiers.  Provided positive result.  Reviewed safe sex practices, notifying partners, and refraining from sexual activities for 7 days from time of treatment.  Patient verified understanding, all questions answered.   HHS notified Verified pharmacy, prescription sent

## 2023-01-07 ENCOUNTER — Telehealth (HOSPITAL_COMMUNITY): Payer: Self-pay | Admitting: Emergency Medicine

## 2023-01-07 ENCOUNTER — Ambulatory Visit (HOSPITAL_COMMUNITY)
Admission: EM | Admit: 2023-01-07 | Discharge: 2023-01-07 | Disposition: A | Discharge status: Home / Self Care | Payer: Medicaid Other | Attending: Physician Assistant | Admitting: Physician Assistant

## 2023-01-07 ENCOUNTER — Encounter (HOSPITAL_COMMUNITY): Payer: Self-pay | Admitting: Emergency Medicine

## 2023-01-07 DIAGNOSIS — A549 Gonococcal infection, unspecified: Secondary | ICD-10-CM | POA: Diagnosis not present

## 2023-01-07 MED ORDER — GENTAMICIN SULFATE 40 MG/ML IJ SOLN
240.0000 mg | Freq: Once | INTRAMUSCULAR | Status: AC
  Administered 2023-01-07: 240 mg via INTRAMUSCULAR

## 2023-01-07 MED ORDER — AZITHROMYCIN 500 MG PO TABS: 2000.0000 mg | ORAL_TABLET | Freq: Once | ORAL | 0 refills | Status: AC

## 2023-01-07 MED ORDER — GENTAMICIN SULFATE 40 MG/ML IJ SOLN
INTRAMUSCULAR | Status: AC
  Filled 2023-01-07: qty 6

## 2023-01-07 MED ORDER — AZITHROMYCIN 500 MG PO TABS: 2000.0000 mg | ORAL_TABLET | Freq: Once | ORAL | 0 refills | Status: DC

## 2023-01-07 NOTE — ED Triage Notes (Signed)
Pt reports was called about having been positive for 2 STDs and needs treatment

## 2023-03-01 ENCOUNTER — Encounter (HOSPITAL_COMMUNITY): Payer: Self-pay | Admitting: Emergency Medicine

## 2023-03-01 ENCOUNTER — Emergency Department (HOSPITAL_COMMUNITY)
Admission: EM | Admit: 2023-03-01 | Discharge: 2023-03-01 | Discharge status: Against Medical Advice | Payer: Medicaid Other | Attending: Emergency Medicine | Admitting: Emergency Medicine

## 2023-03-01 DIAGNOSIS — Z5321 Procedure and treatment not carried out due to patient leaving prior to being seen by health care provider: Secondary | ICD-10-CM | POA: Diagnosis not present

## 2023-03-01 DIAGNOSIS — R062 Wheezing: Secondary | ICD-10-CM | POA: Insufficient documentation

## 2023-03-01 MED ORDER — IPRATROPIUM-ALBUTEROL 0.5-2.5 (3) MG/3ML IN SOLN
3.0000 mL | Freq: Once | RESPIRATORY_TRACT | Status: AC
  Administered 2023-03-01: 3 mL via RESPIRATORY_TRACT
  Filled 2023-03-01: qty 3

## 2023-03-01 NOTE — ED Notes (Signed)
Pt called for vitals, no response at this time 

## 2023-03-01 NOTE — ED Triage Notes (Signed)
Pt having wheezing starting 2 days ago. Audibly wheezing noted. Requesting px for inhaler as well.

## 2023-03-01 NOTE — ED Notes (Signed)
Pt called for vitals and room multiple times, no response

## 2023-06-26 ENCOUNTER — Encounter: Payer: Medicaid Other | Admitting: Internal Medicine

## 2023-11-20 ENCOUNTER — Other Ambulatory Visit: Payer: Self-pay

## 2023-11-20 ENCOUNTER — Encounter (HOSPITAL_COMMUNITY): Payer: Self-pay | Admitting: Emergency Medicine

## 2023-11-20 ENCOUNTER — Ambulatory Visit (HOSPITAL_COMMUNITY)
Admission: EM | Admit: 2023-11-20 | Discharge: 2023-11-20 | Disposition: A | Discharge status: Home / Self Care | Payer: Medicaid Other | Attending: Internal Medicine | Admitting: Internal Medicine

## 2023-11-20 DIAGNOSIS — J069 Acute upper respiratory infection, unspecified: Secondary | ICD-10-CM | POA: Diagnosis not present

## 2023-11-20 DIAGNOSIS — J4521 Mild intermittent asthma with (acute) exacerbation: Secondary | ICD-10-CM

## 2023-11-20 LAB — POC COVID19/FLU A&B COMBO
Covid Antigen, POC: NEGATIVE
Influenza A Antigen, POC: NEGATIVE
Influenza B Antigen, POC: NEGATIVE

## 2023-11-20 MED ORDER — BENZONATATE 200 MG PO CAPS: 200.0000 mg | ORAL_CAPSULE | Freq: Three times a day (TID) | ORAL | 0 refills | Status: AC | PRN

## 2023-11-20 MED ORDER — ALBUTEROL SULFATE (2.5 MG/3ML) 0.083% IN NEBU
2.5000 mg | INHALATION_SOLUTION | Freq: Once | RESPIRATORY_TRACT | Status: AC
  Administered 2023-11-20: 2.5 mg via RESPIRATORY_TRACT

## 2023-11-20 MED ORDER — ALBUTEROL SULFATE (2.5 MG/3ML) 0.083% IN NEBU
INHALATION_SOLUTION | RESPIRATORY_TRACT | Status: AC
  Filled 2023-11-20: qty 3

## 2023-11-20 NOTE — ED Provider Notes (Signed)
 [facility]    CSN: [id number] Arrival date & time: 11/20/23  1146      History   Chief Complaint Chief Complaint  Patient presents with   URI    HPI [name] is a 23 y.o. male presents with URI symptoms since yesterday. This am woke up and felt worse with HA and body aches and swelling. Has felt wheezy and been more SOB and would like a neb treatment since this seems to help better than his inhaler. He also needs a note for work.   He denies fever or  body aches.    Past Medical History:  Diagnosis Date   ADHD (attention deficit hyperactivity disorder)    Anxiety    Anxiety w/ Panic Attacks   Asthma    Deliberate self-cutting    Overdose    Multiple Overdose attempts   Severe major depressive disorder [facility])     Patient Active Problem List   Diagnosis Date Noted   DMDD (disruptive mood dysregulation disorder) (HCC) 05/11/2017   Bipolar 2 disorder (HCC) 02/13/2016   Major depressive disorder, recurrent, moderate (HCC) 06/02/2015   Suicide attempt by drug ingestion (HCC) 05/30/2014   Generalized anxiety disorder 04/19/2014   Oppositional defiant disorder 04/19/2014    Past Surgical History:  Procedure Laterality Date   CIRCUMCISION       Home Medications    Prior to Admission medications   Medication Sig Start Date End Date Taking? Authorizing Provider  benzonatate  (TESSALON ) 200 MG capsule Take 1 capsule (200 mg total) by mouth 3 (three) times daily as needed for cough. 11/20/23  Yes Rodriguez-Southworth, Lonnie Rosado, PA-C  albuterol  (VENTOLIN  HFA) 108 (90 Base) MCG/ACT inhaler Inhale 2 puffs into the lungs every 6 (six) hours as needed for wheezing or shortness of breath. 08/03/22   Midge Golas, MD  chlorproMAZINE  (THORAZINE ) 25 MG tablet Take 1 tablet (25 mg total) by mouth 2 (two) times daily. Patient not taking: Reported on 07/26/2017 05/19/17 06/17/19  Saez-Benito, Eva CHRISTELLA Rav, MD  divalproex  (DEPAKOTE  ER) 250 MG 24 hr tablet Take 3 tablets  (750 mg total) by mouth daily. Patient not taking: Reported on 07/26/2017 05/20/17 06/17/19  Saez-Benito, Eva CHRISTELLA Rav, MD    Family History Family History  Problem Relation Age of Onset   Asthma Mother     Social History Social History   Tobacco Use   Smoking status: Former    Types: Cigarettes   Smokeless tobacco: Never  Vaping Use   Vaping status: Never Used  Substance Use Topics   Alcohol use: No   Drug use: Yes    Types: Marijuana     Allergies   Penicillins   Review of Systems Review of Systems  As noted in HPI Physical Exam Triage Vital Signs ED Triage Vitals [11/20/23 1256]  Encounter Vitals Group     BP 117/77     Systolic BP Percentile      Diastolic BP Percentile      Pulse Rate 66     Resp 18     Temp 98 F (36.7 C)     Temp Source Oral     SpO2 95 %     Weight      Height      Head Circumference      Peak Flow      Pain Score 0     Pain Loc      Pain Education      Exclude from Growth Chart  No data found.  Updated Vital Signs BP 117/77 (BP Location: Right Arm)   Pulse 66   Temp 98 F (36.7 C) (Oral)   Resp 18   SpO2 95%   Visual Acuity Right Eye Distance:   Left Eye Distance:   Bilateral Distance:    Right Eye Near:   Left Eye Near:    Bilateral Near:     Physical Exam   UC Treatments / Results  Labs (all labs ordered are listed, but only abnormal results are displayed) Labs Reviewed  POC COVID19/FLU A&B COMBO    EKG   Radiology No results found.  Procedures Procedures (including critical care time)  Medications Ordered in UC Medications  albuterol  (PROVENTIL ) (2.5 MG/3ML) 0.083% nebulizer solution 2.5 mg (2.5 mg Nebulization Given 11/20/23 1338)    Initial Impression / Assessment and Plan / UC Course  I have reviewed the triage vital signs and the nursing notes.  Pertinent labs  results that were available during my care of the patient were reviewed by me and considered in my medical decision making  (see chart for details).  He was given albuterol  neb and he was breathing better and pulse ox went up to 98%  Viral URI with cough Asthma exacerbation  I placed him on Tessalon  perless as noted. May Use his inhaler q 4-6 h prn.    Final Clinical Impressions(s) / UC Diagnoses   Final diagnoses:  Upper respiratory tract infection, unspecified type   Discharge Instructions   None    ED Prescriptions     Medication Sig Dispense Auth. Provider   benzonatate  (TESSALON ) 200 MG capsule Take 1 capsule (200 mg total) by mouth 3 (three) times daily as needed for cough. 30 capsule Rodriguez-Southworth, Korby Ratay, PA-C      PDMP not reviewed this encounter.   Lindi Carter, PA-C 11/20/23 1829

## 2023-11-20 NOTE — ED Triage Notes (Signed)
 Pt having cold like symptoms with cough, chest congestion and running nose for the past 2 days, pt is requesting a note for work.
